# Patient Record
Sex: Male | Born: 1990 | Race: White | Hispanic: No | Marital: Single | Smoking: Current some day smoker
Health system: Southern US, Community
[De-identification: ages and names within clinical notes are randomized; demographics above are authoritative.]

## PROBLEM LIST (undated history)

## (undated) DIAGNOSIS — M549 Dorsalgia, unspecified: Secondary | ICD-10-CM

## (undated) HISTORY — PX: TYMPANOSTOMY TUBE PLACEMENT: SHX32

---
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
## 2011-04-25 ENCOUNTER — Inpatient Hospital Stay (INDEPENDENT_AMBULATORY_CARE_PROVIDER_SITE_OTHER)
Admission: RE | Admit: 2011-04-25 | Discharge: 2011-04-25 | Disposition: A | Discharge status: Home / Self Care | Payer: 59 | Source: Ambulatory Visit | Attending: Emergency Medicine | Admitting: Emergency Medicine

## 2011-04-25 DIAGNOSIS — J069 Acute upper respiratory infection, unspecified: Secondary | ICD-10-CM

## 2013-05-01 ENCOUNTER — Encounter (HOSPITAL_COMMUNITY): Payer: Self-pay | Admitting: Emergency Medicine

## 2013-05-01 ENCOUNTER — Emergency Department (HOSPITAL_COMMUNITY)
Admission: EM | Admit: 2013-05-01 | Discharge: 2013-05-01 | Disposition: A | Discharge status: Home / Self Care | Payer: 59 | Attending: Emergency Medicine | Admitting: Emergency Medicine

## 2013-05-01 DIAGNOSIS — Z8669 Personal history of other diseases of the nervous system and sense organs: Secondary | ICD-10-CM | POA: Insufficient documentation

## 2013-05-01 DIAGNOSIS — R599 Enlarged lymph nodes, unspecified: Secondary | ICD-10-CM | POA: Insufficient documentation

## 2013-05-01 DIAGNOSIS — F17200 Nicotine dependence, unspecified, uncomplicated: Secondary | ICD-10-CM | POA: Insufficient documentation

## 2013-05-01 DIAGNOSIS — K920 Hematemesis: Secondary | ICD-10-CM | POA: Insufficient documentation

## 2013-05-01 DIAGNOSIS — R1084 Generalized abdominal pain: Secondary | ICD-10-CM | POA: Insufficient documentation

## 2013-05-01 DIAGNOSIS — F430 Acute stress reaction: Secondary | ICD-10-CM | POA: Insufficient documentation

## 2013-05-01 DIAGNOSIS — R590 Localized enlarged lymph nodes: Secondary | ICD-10-CM

## 2013-05-01 DIAGNOSIS — J069 Acute upper respiratory infection, unspecified: Secondary | ICD-10-CM | POA: Insufficient documentation

## 2013-05-01 HISTORY — DX: Dorsalgia, unspecified: M54.9

## 2013-05-01 LAB — CBC WITH DIFFERENTIAL/PLATELET
Basophils Absolute: 0 10*3/uL (ref 0.0–0.1)
Basophils Relative: 0 % (ref 0–1)
Eosinophils Absolute: 0.1 10*3/uL (ref 0.0–0.7)
Eosinophils Relative: 1 % (ref 0–5)
HCT: 43.6 % (ref 39.0–52.0)
Lymphocytes Relative: 16 % (ref 12–46)
MCH: 31.2 pg (ref 26.0–34.0)
MCV: 90.6 fL (ref 78.0–100.0)
Monocytes Absolute: 0.8 10*3/uL (ref 0.1–1.0)
Neutro Abs: 7.7 10*3/uL (ref 1.7–7.7)
Neutrophils Relative %: 75 % (ref 43–77)
Platelets: 204 10*3/uL (ref 150–400)
RBC: 4.81 MIL/uL (ref 4.22–5.81)
RDW: 12.5 % (ref 11.5–15.5)
WBC: 10.2 10*3/uL (ref 4.0–10.5)

## 2013-05-01 LAB — RAPID STREP SCREEN (MED CTR MEBANE ONLY): Streptococcus, Group A Screen (Direct): NEGATIVE

## 2013-05-01 LAB — MONONUCLEOSIS SCREEN: Mono Screen: NEGATIVE

## 2013-05-01 MED ORDER — TRAMADOL HCL 50 MG PO TABS
ORAL_TABLET | ORAL | Status: AC
  Filled 2013-05-01: qty 1

## 2013-05-01 MED ORDER — TRAMADOL HCL 50 MG PO TABS
50.0000 mg | ORAL_TABLET | Freq: Once | ORAL | Status: AC
  Administered 2013-05-01: 50 mg via ORAL

## 2013-05-01 NOTE — ED Notes (Signed)
MD at bedside. 

## 2013-05-01 NOTE — ED Notes (Signed)
Pt complaining of fatigue, knots on right of neck, some sore throat, right ear pain.

## 2013-05-01 NOTE — ED Notes (Signed)
Pt c/o "swollen lymph notes on both sides of neck times x 2 weeks". Pt c/o sore throat and knot on right side of the neck. Pt reports "little bit of blood in my throw-up". Reports 2-3 episodes of emesis for past 2-3 days "I had to make myself to throw up. I felt really bad"

## 2013-05-01 NOTE — ED Provider Notes (Signed)
CSN: [id number]     Arrival date & time 05/01/13  4098 History  This chart was scribed for Geoffery Lyons, MD by Bennett Scrape, ED Scribe. This patient was seen in room APA07/APA07 and the patient's care was started at 8:30 PM.   Chief Complaint  Patient presents with  . Sore Throat  . Emesis    The history is provided by the patient. No language interpreter was used.    HPI Comments: Shaun Fox is a 22 y.o. male who presents to the Emergency Department complaining of persistent, gradually worsening right cervical lymph node swelling for the past 2 to 3 weeks. He denies any pain with the initial onset and states that the left side improved. He reports that 2 days ago the lymph nodes on the right side has been gradually increasing in size. He states that now he has constant pain that is worse with movement of his neck and opening his jaw with the associated development of a sore throat today. He denies any fevers or involvement of lymph nodes elsewhere. He also states that he had severe diffuse abdominal pain that resolved after 45 minutes today but denies any further episodes. He also reports hematemesis for the past 3 days stating that the emesis occasionally described as streaked with red blood and occasionally completely all red blood. He reports increased stress as well. He denies any sick contacts with similar symptoms. He has a h/o chronic ear infections but states that the last one occurred around the ago of 15. He had tubes placed in his ears.  Past Medical History  Diagnosis Date  . Back pain    Past Surgical History  Procedure Laterality Date  . Tympanostomy tube placement     No family history on file. History  Substance Use Topics  . Smoking status: Current Some Day Smoker  . Smokeless tobacco: Not on file  . Alcohol Use: Yes     Comment: occasional    Review of Systems  Constitutional: Negative for fever.  HENT: Positive for sore throat.   Gastrointestinal:  Positive for abdominal pain (now resolved ). Negative for nausea, vomiting and diarrhea.  Hematological: Positive for adenopathy.  All other systems reviewed and are negative.    Allergies  Acetaminophen and Benadryl  Home Medications  No current outpatient prescriptions on file.  Triage Vitals: BP 119/82  Pulse 81  Temp(Src) 98.1 F (36.7 C) (Oral)  Resp 20  Ht 5\' 9"  (1.753 m)  Wt 152 lb (68.947 kg)  BMI 22.44 kg/m2  SpO2 100%  Physical Exam  Nursing note and vitals reviewed. Constitutional: He is oriented to person, place, and time. He appears well-developed and well-nourished. No distress.  HENT:  Head: Normocephalic and atraumatic.  Mild inflammation of the PO but no exudates   Eyes: Conjunctivae and EOM are normal.  Neck: Normal range of motion. Neck supple. No tracheal deviation present.  Cardiovascular: Normal rate, regular rhythm and normal heart sounds.   No murmur heard. Pulmonary/Chest: Effort normal and breath sounds normal. No respiratory distress. He has no wheezes. He has no rales.  Abdominal: Soft. Bowel sounds are normal. There is no tenderness.  No splenomegaly   Musculoskeletal: Normal range of motion. He exhibits no edema.  Lymphadenopathy:    He has cervical adenopathy.  Neurological: He is alert and oriented to person, place, and time. No cranial nerve deficit.  Skin: Skin is warm and dry.  Psychiatric: He has a normal mood and affect. His behavior  is normal.    ED Course  Procedures (including critical care time)  DIAGNOSTIC STUDIES: Oxygen Saturation is 100% on room air, normal by my interpretation.    COORDINATION OF CARE: 8:38 PM-Discussed treatment plan which includes mononucleosis screen, CBC panel and rapid strep screen with pt at bedside and pt agreed to plan.   9:37 PM-Informed pt of negative lab work results. Pt has not had any vomiting in the ED. Advised pt that symptoms are most likely viral. Discussed discharge plan which includes  ibuprofen, rest and plenty of fluids with pt and pt agreed to plan. Also advised pt to follow up as needed and pt agreed. Addressed symptoms to return for with pt.   Labs Review Labs Reviewed  RAPID STREP SCREEN  CULTURE, GROUP A STREP  MONONUCLEOSIS SCREEN  CBC WITH DIFFERENTIAL   Imaging Review No results found.  EKG Interpretation   None       MDM  No diagnosis found. Patient presents with complaints of swollen lymph nodes in his neck and sore throat for the past 2 weeks. He states that he has felt fatigued and has had no energy. He denies sick contacts.  On exam he is afebrile vitals are stable. Physical examination is unremarkable with the exception of tender cervical lymph nodes which are mildly enlarged. I was suspicious of infectious mononucleosis however the CBC and Monospot did not indicate this. He was also negative for group A strep. I suspect this is a viral illness that will require time and patience. I've also recommended anti-inflammatories, plenty of fluids, rest and followup with his primary Dr. if not improving in the next 2-3 weeks.  I personally performed the services described in this documentation, which was scribed in my presence. The recorded information has been reviewed and is accurate.       Geoffery Lyons, MD 05/01/13 [id number]

## 2013-05-01 NOTE — ED Notes (Signed)
Patient given discharge instruction, verbalized understand. Patient ambulatory out of the department.  

## 2013-05-02 ENCOUNTER — Emergency Department (HOSPITAL_COMMUNITY): Payer: 59

## 2013-05-02 ENCOUNTER — Emergency Department (HOSPITAL_COMMUNITY)
Admission: EM | Admit: 2013-05-02 | Discharge: 2013-05-02 | Disposition: A | Discharge status: Home / Self Care | Payer: 59 | Attending: Emergency Medicine | Admitting: Emergency Medicine

## 2013-05-02 ENCOUNTER — Encounter (HOSPITAL_COMMUNITY): Payer: Self-pay | Admitting: Emergency Medicine

## 2013-05-02 DIAGNOSIS — H9209 Otalgia, unspecified ear: Secondary | ICD-10-CM | POA: Insufficient documentation

## 2013-05-02 DIAGNOSIS — J029 Acute pharyngitis, unspecified: Secondary | ICD-10-CM | POA: Insufficient documentation

## 2013-05-02 DIAGNOSIS — I889 Nonspecific lymphadenitis, unspecified: Secondary | ICD-10-CM

## 2013-05-02 DIAGNOSIS — F17200 Nicotine dependence, unspecified, uncomplicated: Secondary | ICD-10-CM | POA: Insufficient documentation

## 2013-05-02 LAB — BASIC METABOLIC PANEL
BUN: 6 mg/dL (ref 6–23)
Chloride: 94 mEq/L — ABNORMAL LOW (ref 96–112)
Creatinine, Ser: 1.1 mg/dL (ref 0.50–1.35)
GFR calc Af Amer: >90 mL/min (ref >90)
GFR calc non Af Amer: >90 mL/min (ref >90)
Glucose, Bld: 89 mg/dL (ref 70–99)
Potassium: 3.7 mEq/L (ref 3.5–5.1)
Sodium: 133 mEq/L — ABNORMAL LOW (ref 135–145)

## 2013-05-02 LAB — CBC WITH DIFFERENTIAL/PLATELET
Basophils Absolute: 0 10*3/uL (ref 0.0–0.1)
Basophils Relative: 0 % (ref 0–1)
Eosinophils Relative: 2 % (ref 0–5)
HCT: 45.4 % (ref 39.0–52.0)
Hemoglobin: 15.6 g/dL (ref 13.0–17.0)
Lymphs Abs: 1.5 10*3/uL (ref 0.7–4.0)
MCH: 31.4 pg (ref 26.0–34.0)
Monocytes Relative: 11 % (ref 3–12)
Neutro Abs: 6.4 10*3/uL (ref 1.7–7.7)
Neutrophils Relative %: 71 % (ref 43–77)
Platelets: 195 10*3/uL (ref 150–400)
RBC: 4.97 MIL/uL (ref 4.22–5.81)
RDW: 12.6 % (ref 11.5–15.5)

## 2013-05-02 MED ORDER — AMOXICILLIN-POT CLAVULANATE 875-125 MG PO TABS: 1.0000 | ORAL_TABLET | Freq: Two times a day (BID) | ORAL | Status: DC

## 2013-05-02 MED ORDER — TRAMADOL HCL 50 MG PO TABS: 50.0000 mg | ORAL_TABLET | Freq: Four times a day (QID) | ORAL | Status: DC | PRN

## 2013-05-02 MED ORDER — HYDROMORPHONE HCL PF 1 MG/ML IJ SOLN
INTRAMUSCULAR | Status: AC
  Filled 2013-05-02: qty 1

## 2013-05-02 MED ORDER — NAPROXEN 500 MG PO TABS: 500.0000 mg | ORAL_TABLET | Freq: Two times a day (BID) | ORAL | Status: DC

## 2013-05-02 MED ORDER — HYDROMORPHONE HCL PF 1 MG/ML IJ SOLN
1.0000 mg | Freq: Once | INTRAMUSCULAR | Status: AC
  Administered 2013-05-02: 1 mg via INTRAVENOUS
  Filled 2013-05-02: qty 1

## 2013-05-02 MED ORDER — SODIUM CHLORIDE 0.9 % IV BOLUS (SEPSIS)
1000.0000 mL | Freq: Once | INTRAVENOUS | Status: AC
  Administered 2013-05-02: 1000 mL via INTRAVENOUS

## 2013-05-02 MED ORDER — LORAZEPAM 2 MG/ML IJ SOLN
0.5000 mg | Freq: Once | INTRAMUSCULAR | Status: AC
  Administered 2013-05-02: 0.5 mg via INTRAVENOUS
  Filled 2013-05-02: qty 1

## 2013-05-02 MED ORDER — IOHEXOL 300 MG/ML  SOLN
75.0000 mL | Freq: Once | INTRAMUSCULAR | Status: AC | PRN
  Administered 2013-05-02: 75 mL via INTRAVENOUS

## 2013-05-02 NOTE — ED Notes (Signed)
Patient to Ed c/o neck pain. States that he was seen here yesterday for neck pain and swollen lymph nodes. He states he was told to go home and get some rest and to take ibuprofen. Patient states that he did not get any rest last night, total of 2 hours of sleep and could not move neck when he awoke this morning. Patient grimacing, keeps sunglasses on, states 12/10 pain on his neck. No ibuprofen taken.

## 2013-05-02 NOTE — ED Notes (Signed)
Patient with no complaints at this time. Respirations even and unlabored. Skin warm/dry. Discharge instructions reviewed with patient at this time. Patient given opportunity to voice concerns/ask questions. Patient discharged at this time and left Emergency Department with steady gait.   

## 2013-05-02 NOTE — ED Provider Notes (Signed)
CSN: [id number]     Arrival date & time 05/02/13  1152 History  This chart was scribed for Benny Lennert, MD by Caryn Bee, ED Scribe. This patient was seen in room APA17/APA17 and the patient's care was started 1:18 PM.    Chief Complaint  Patient presents with  . Neck Pain    Patient is a 22 y.o. male presenting with pharyngitis. The history is provided by the patient. No language interpreter was used.  Sore Throat This is a new problem. The current episode started yesterday. The problem occurs constantly. The problem has not changed since onset.Pertinent negatives include no chest pain, no abdominal pain, no headaches and no shortness of breath. The symptoms are aggravated by swallowing. Nothing relieves the symptoms. He has tried nothing for the symptoms.   HPI Comments: Shaun Fox is a 21 y.o. male who presents to the Emergency Department complaining of gradual onset severe neck pain that began when he woke this morning. Pt states that a couple of weeks ago both of the lymphnodes in his neck were swollen, but had improved. Pt also has associated pain when he opens his mouth, in his ears, and in his jaw. Pt was seen here yesterday for similar symptoms and was discharged with ibuprofen. He has not taken any ibuprofen for the pain today. Pt denies cough. Pt PCP is Dr. Gerda Diss.   Past Medical History  Diagnosis Date  . Back pain    Past Surgical History  Procedure Laterality Date  . Tympanostomy tube placement     No family history on file. History  Substance Use Topics  . Smoking status: Current Some Day Smoker  . Smokeless tobacco: Not on file  . Alcohol Use: 2.4 oz/week    4 Cans of beer per week     Comment: occasional    Review of Systems  Constitutional: Negative for appetite change and fatigue.  HENT: Positive for ear pain, sore throat and trouble swallowing. Negative for congestion, ear discharge and sinus pressure.   Eyes: Negative for discharge.  Respiratory:  Negative for cough and shortness of breath.   Cardiovascular: Negative for chest pain.  Gastrointestinal: Negative for abdominal pain and diarrhea.  Genitourinary: Negative for frequency and hematuria.  Musculoskeletal: Negative for back pain.  Skin: Negative for rash.  Neurological: Negative for seizures and headaches.  Psychiatric/Behavioral: Negative for hallucinations.  All other systems reviewed and are negative.    Allergies  Acetaminophen; Benadryl; and Ceclor  Home Medications    Triage Vitals: BP 127/58  Pulse 87  Temp(Src) 99.3 F (37.4 C) (Oral)  Resp 16  Ht 5\' 9"  (1.753 m)  Wt 155 lb (70.308 kg)  BMI 22.88 kg/m2  SpO2 100%  Physical Exam  Nursing note and vitals reviewed. Constitutional: He is oriented to person, place, and time. He appears well-developed and well-nourished.  HENT:  Head: Normocephalic.  Eyes: Conjunctivae and EOM are normal. No scleral icterus.  Neck: Neck supple. No thyromegaly present.  Tenderness and edema 2 cm by 1 cm lateral right neck.  Cardiovascular: Normal rate, regular rhythm and normal heart sounds.  Exam reveals no gallop and no friction rub.   No murmur heard. Pulmonary/Chest: Effort normal and breath sounds normal. No stridor. He has no wheezes. He has no rales. He exhibits no tenderness.  Abdominal: He exhibits no distension. There is no tenderness. There is no rebound.  Musculoskeletal: Normal range of motion. He exhibits no edema.  Lymphadenopathy:    He has  no cervical adenopathy.  Neurological: He is alert and oriented to person, place, and time. He exhibits normal muscle tone. Coordination normal.  Skin: No rash noted. No erythema.  Psychiatric: He has a normal mood and affect. His behavior is normal.    ED Course  Procedures (including critical care time) DIAGNOSTIC STUDIES: Oxygen Saturation is 100% on room air, normal by my interpretation.    COORDINATION OF CARE: 1:23 PM-Discussed treatment plan which includes  CBC, basic metabolic panel, pain medications with pt at bedside and pt agreed to plan.   Labs Review Labs Reviewed  BASIC METABOLIC PANEL - Abnormal; Notable for the following:    Sodium 133 (*)    Chloride 94 (*)    All other components within normal limits  CBC WITH DIFFERENTIAL   Imaging Review No results found.  EKG Interpretation   None      Medications  HYDROmorphone (DILAUDID) injection 1 mg (not administered)  sodium chloride 0.9 % bolus 1,000 mL (1,000 mLs Intravenous New Bag/Given 05/02/13 1410)  HYDROmorphone (DILAUDID) injection 1 mg (1 mg Intravenous Given 05/02/13 1410)  LORazepam (ATIVAN) injection 0.5 mg (0.5 mg Intravenous Given 05/02/13 1410)    MDM  No diagnosis found. lymphadenits The chart was scribed for me under my direct supervision.  I personally performed the history, physical, and medical decision making and all procedures in the evaluation of this patient.Benny Lennert, MD 05/02/13 2115

## 2013-05-03 LAB — CULTURE, GROUP A STREP

## 2013-05-23 ENCOUNTER — Ambulatory Visit (INDEPENDENT_AMBULATORY_CARE_PROVIDER_SITE_OTHER): Payer: 59 | Admitting: Family Medicine

## 2013-05-23 ENCOUNTER — Encounter: Payer: Self-pay | Admitting: Family Medicine

## 2013-05-23 VITALS — BP 124/68 | Temp 98.3°F | Ht 68.0 in | Wt 154.0 lb

## 2013-05-23 DIAGNOSIS — I889 Nonspecific lymphadenitis, unspecified: Secondary | ICD-10-CM

## 2013-05-23 LAB — CBC WITH DIFFERENTIAL/PLATELET
Basophils Relative: 0 % (ref 0–1)
Eosinophils Absolute: 0.2 10*3/uL (ref 0.0–0.7)
Hemoglobin: 15.5 g/dL (ref 13.0–17.0)
Lymphocytes Relative: 22 % (ref 12–46)
Lymphs Abs: 1.9 10*3/uL (ref 0.7–4.0)
MCH: 31.6 pg (ref 26.0–34.0)
MCHC: 34.6 g/dL (ref 30.0–36.0)
MCV: 91.4 fL (ref 78.0–100.0)
Monocytes Absolute: 0.8 10*3/uL (ref 0.1–1.0)
Monocytes Relative: 9 % (ref 3–12)
Neutro Abs: 5.8 10*3/uL (ref 1.7–7.7)
Neutrophils Relative %: 67 % (ref 43–77)
Platelets: 264 10*3/uL (ref 150–400)
RBC: 4.9 MIL/uL (ref 4.22–5.81)
RDW: 13.7 % (ref 11.5–15.5)
WBC: 8.7 10*3/uL (ref 4.0–10.5)

## 2013-05-23 MED ORDER — CLARITHROMYCIN 500 MG PO TABS: 500.0000 mg | ORAL_TABLET | Freq: Two times a day (BID) | ORAL | Status: AC

## 2013-05-23 MED ORDER — TRAMADOL HCL 50 MG PO TABS: 50.0000 mg | ORAL_TABLET | Freq: Four times a day (QID) | ORAL | Status: DC | PRN

## 2013-05-23 NOTE — Progress Notes (Signed)
  Subjective:    Patient ID: Shaun Fox, male    DOB: 04-Aug-1990, 22 y.o.   MRN: [id number]  HPIFollow up from ER. Patient went to ER two different times for ear pain, neck pain and swollen glands. He has finished the antibiotic and the naproxen that was prescribed.  Patient was seen in the ER they did a CAT scan to look at the lymph nodes the lymph nodes are swollen tender painful on the right side did not respond to the Augmentin. No fever sweats chills nausea or vomiting. Energy level slightly subpar. PMH benign. Patient is not around any cats. No other particular problems. Family history noncontributory.   Review of Systems See above. No night sweats no weight loss. Energy level halfway decent. No other particular problems.    Objective:   Physical Exam Right side neck is swollen lymph nodes are enlarged. There is some tenderness to the lymph nodes. Extremities no edema abdomen soft lungs clear heart regular pulse normal       Assessment & Plan:  Cervical lymphadenitis-persistent. I would recommend a round of antibiotics. Also recommend that we do lab work. In addition to this will need to set up with ENT. If the lab work is not revealing and patient persists with this cervical lymphadenitis he will need a lymph node biopsy. The possibility of lymphoma is low because he does have tenderness with the lymph nodes. Patient understands the importance of following through with all of this.

## 2013-05-24 LAB — EPSTEIN-BARR VIRUS VCA, IGM: EBV VCA IgM: <10 U/mL (ref <36.0)

## 2013-05-24 LAB — EPSTEIN-BARR VIRUS VCA, IGG: EBV VCA IgG: <10 U/mL (ref <18.0)

## 2013-05-24 LAB — EPSTEIN-BARR VIRUS NUCLEAR ANTIGEN ANTIBODY, IGG: EBV NA IgG: 568 U/mL — ABNORMAL HIGH (ref <18.0)

## 2013-05-24 LAB — SEDIMENTATION RATE: Sed Rate: 4 mm/hr (ref 0–16)

## 2013-05-24 LAB — EPSTEIN-BARR VIRUS EARLY D ANTIGEN ANTIBODY, IGG: EBV EA IgG: <5 U/mL (ref <9.0)

## 2013-05-26 ENCOUNTER — Other Ambulatory Visit: Payer: Self-pay | Admitting: Family Medicine

## 2013-05-26 DIAGNOSIS — R590 Localized enlarged lymph nodes: Secondary | ICD-10-CM

## 2013-06-01 ENCOUNTER — Other Ambulatory Visit: Payer: Self-pay | Admitting: *Deleted

## 2013-06-01 MED ORDER — TRAMADOL HCL 50 MG PO TABS: 50.0000 mg | ORAL_TABLET | Freq: Four times a day (QID) | ORAL | Status: AC | PRN

## 2013-06-02 ENCOUNTER — Other Ambulatory Visit (HOSPITAL_COMMUNITY)
Admission: RE | Admit: 2013-06-02 | Discharge: 2013-06-02 | Disposition: A | Discharge status: Home / Self Care | Payer: 59 | Source: Ambulatory Visit | Attending: Otolaryngology | Admitting: Otolaryngology

## 2013-06-02 ENCOUNTER — Other Ambulatory Visit: Payer: Self-pay | Admitting: Otolaryngology

## 2013-06-02 DIAGNOSIS — R220 Localized swelling, mass and lump, head: Secondary | ICD-10-CM | POA: Insufficient documentation

## 2013-07-25 ENCOUNTER — Telehealth: Payer: Self-pay | Admitting: Family Medicine

## 2013-07-25 DIAGNOSIS — R599 Enlarged lymph nodes, unspecified: Secondary | ICD-10-CM

## 2013-07-25 NOTE — Telephone Encounter (Signed)
Referral put in the system.

## 2013-07-25 NOTE — Telephone Encounter (Signed)
Mom LMOVM for me - requesting referral to different ENT for pt's knots in lymph nodes, has seen Dr. Pollyann Kennedyosen a couple times, still with knots and pain, would like referral to Dr[facility] Susz[facility]  If "ok" please initiate referral in system so that I may process or does pt NTBS? Please advise

## 2013-07-25 NOTE — Telephone Encounter (Signed)
[place]

## 2015-01-04 ENCOUNTER — Encounter: Payer: Self-pay | Admitting: Family Medicine

## 2015-01-04 ENCOUNTER — Ambulatory Visit (INDEPENDENT_AMBULATORY_CARE_PROVIDER_SITE_OTHER): Payer: 59 | Admitting: Family Medicine

## 2015-01-04 VITALS — BP 112/70 | Temp 98.0°F | Ht 69.0 in | Wt 176.0 lb

## 2015-01-04 DIAGNOSIS — H18821 Corneal disorder due to contact lens, right eye: Secondary | ICD-10-CM | POA: Diagnosis not present

## 2015-01-04 DIAGNOSIS — H7291 Unspecified perforation of tympanic membrane, right ear: Secondary | ICD-10-CM

## 2015-01-04 MED ORDER — OFLOXACIN 0.3 % OT SOLN: 5.0000 [drp] | Freq: Every day | OTIC | Status: DC

## 2015-01-04 MED ORDER — CITALOPRAM HYDROBROMIDE 20 MG PO TABS: 20.0000 mg | ORAL_TABLET | Freq: Every day | ORAL | Status: DC

## 2015-01-04 MED ORDER — NYSTATIN 100000 UNIT/ML MT SUSP: OROMUCOSAL | Status: DC

## 2015-01-04 MED ORDER — ALBUTEROL SULFATE HFA 108 (90 BASE) MCG/ACT IN AERS: 2.0000 | INHALATION_SPRAY | RESPIRATORY_TRACT | Status: DC | PRN

## 2015-01-04 MED ORDER — AZITHROMYCIN 250 MG PO TABS: ORAL_TABLET | ORAL | Status: DC

## 2015-01-04 NOTE — Patient Instructions (Signed)
If eye not improved by Monday call  Recheck ear in 3 weeks keep dry as possible  Use ear drops if infection occurs  Call me if any problems

## 2015-01-04 NOTE — Addendum Note (Signed)
Addended by: Margaretha Sheffield on: 01/04/2015 02:31 PM   Modules accepted: Orders, Medications

## 2015-01-04 NOTE — Progress Notes (Signed)
   Subjective:    Patient ID: DETAVIUS BLOXSOM, male    DOB: [birth year], 24 y.o.   MRN: [id number]  HPIplaying in the pool yesterday jumped in and right side of head hit the water and when he came up he could not hear out of right ear. No ear pain.   Having right eye pain. Started this am. Pt wears contacts. He thinks he might have scratched his eye. Sensitive to light.    Denies fever chills sinus pressure pain discomfort  Review of Systems     Objective:   Physical Exam Neck normal sinuses nontender left eardrum normal right ear from small perforation noted with minimal redness around perforation no drainage right eye has injection of sclera but not severe clear watering nontender fluorescent drops were placed into the eye using a blue light Ace small abrasion noted on the cornea not severe       Assessment & Plan:  Right eardrum perforation should heal up gradually over the next 3 weeks there is some redness to the eardrum no drainage the actual perforation is very small probably 2 mm recheck this again in 3 weeks if not healing up referral to ENT, if signs of infection occur use eardrops  Right eye scleral abrasion minimal corneal abrasion should gradually heal up over-the-counter allergy eyedrops when necessary if any signs of infection to call us patient not to use his contact over the next few days

## 2015-03-16 IMAGING — CT CT NECK W/ CM
3 of 4 series · 12 of 33 positions shown, 14 images · IV contrast (Omnipaque 300)
Comparison: None.

CLINICAL DATA: 22-year-old male with neck pain on the right for
several days, increasing. Right ear pain, right jaw pain, radiating
inferiorly. Initial encounter.

EXAM:
CT NECK WITH CONTRAST
TECHNIQUE: Multidetector CT imaging of the neck was performed using the
standard protocol following the bolus administration of intravenous
contrast.
CONTRAST:  75mL OMNIPAQUE IOHEXOL 300 MG/ML  SOLN

[Series 2: soft tissue neck 2.0 b31s · axial · 0.36mm/px · z∈[+65,+233]mm · 4 of 126 slices shown, 5 images]
[im 21/126  soft-tissue]
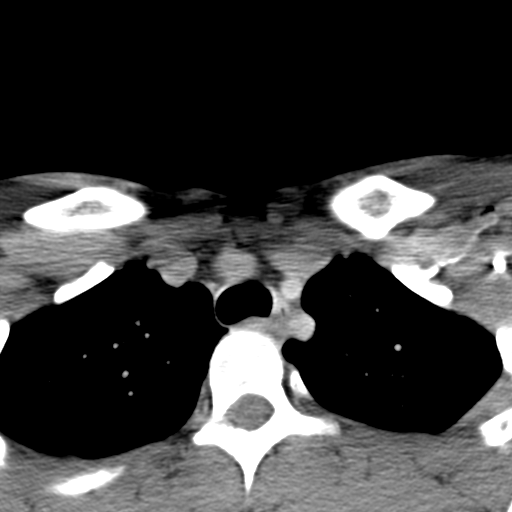
[im 21/126  bone]
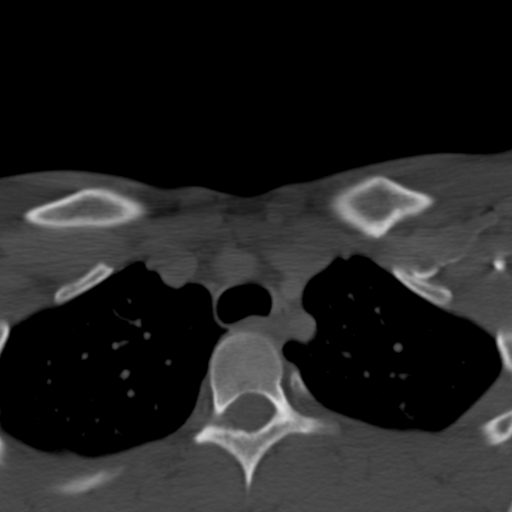
[im 42/126  bone]
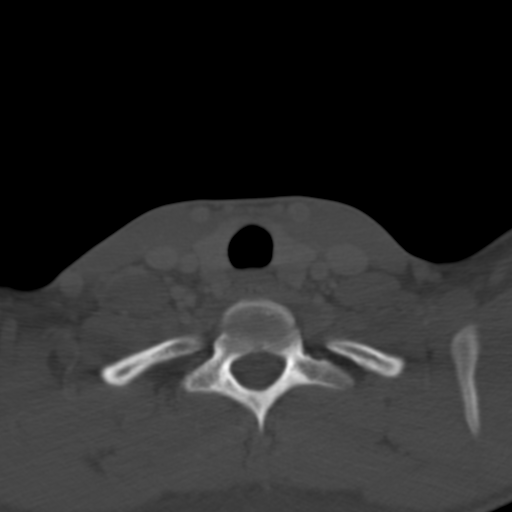
[im 84/126  bone]
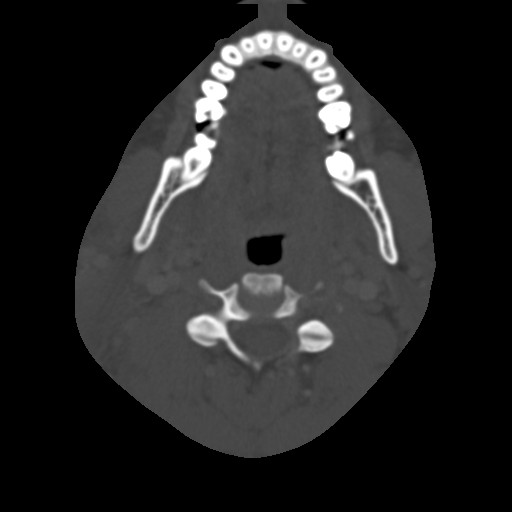
[im 105/126  bone]
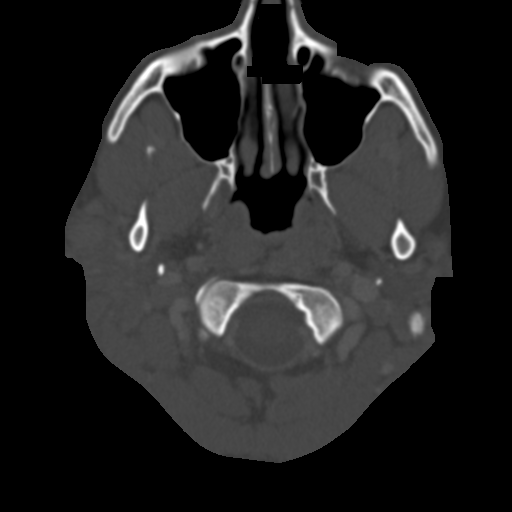

[Series 4: neck 2.0 soft tissue sag · sagittal · 0.35mm/px · 5 of 78 slices shown, 6 images]
[im 26/78  bone]
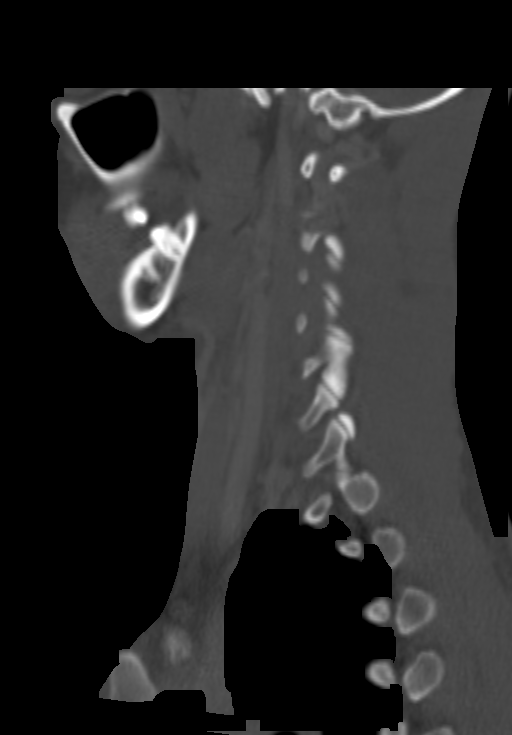
[im 33/78  bone]
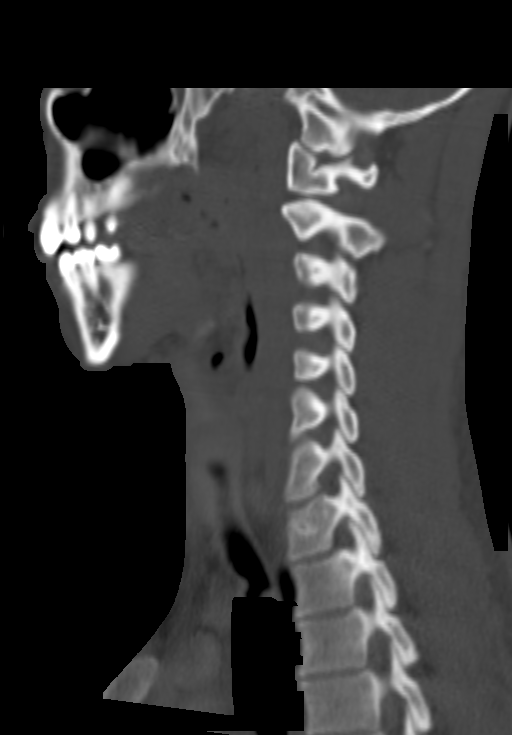
[im 39/78  soft-tissue]
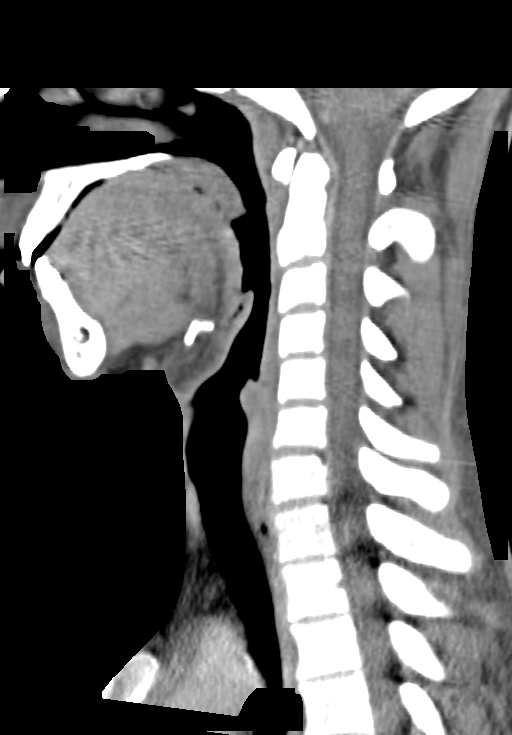
[im 39/78  bone]
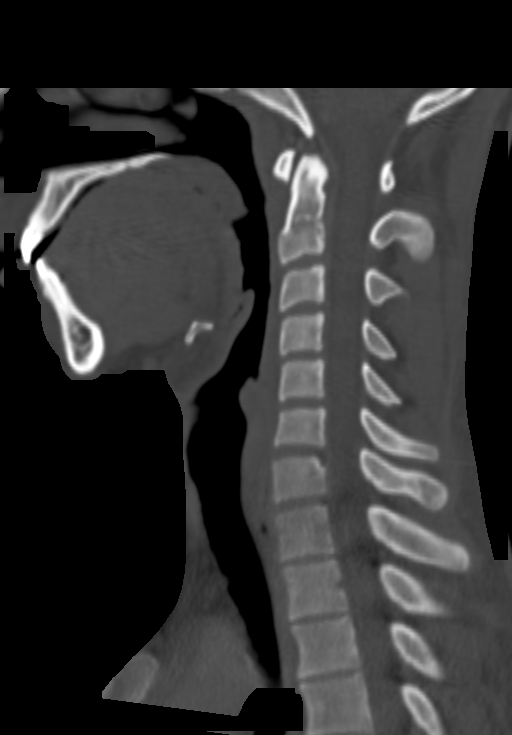
[im 45/78  bone]
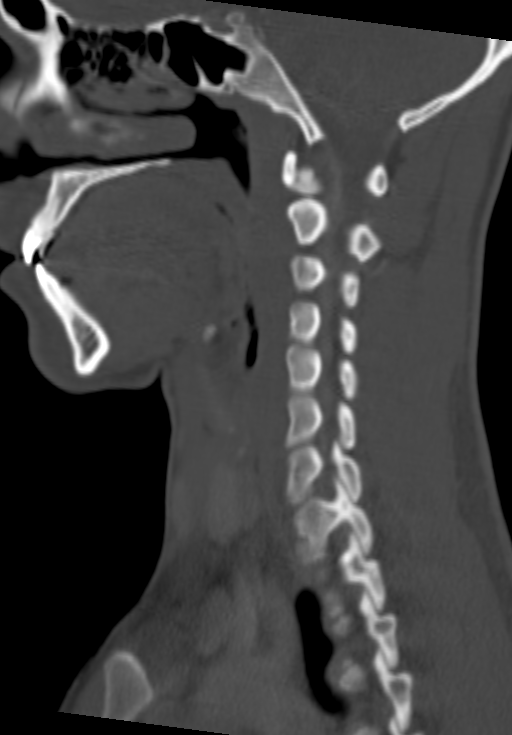
[im 52/78  bone]
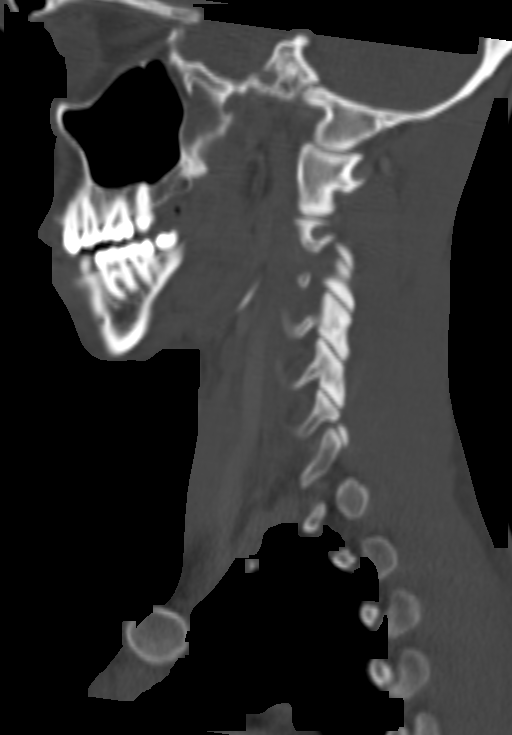

[Series 5: neck 2.0 soft tissue coro · coronal · 0.31mm/px · 3 of 79 slices shown]
[im 16/79  bone]
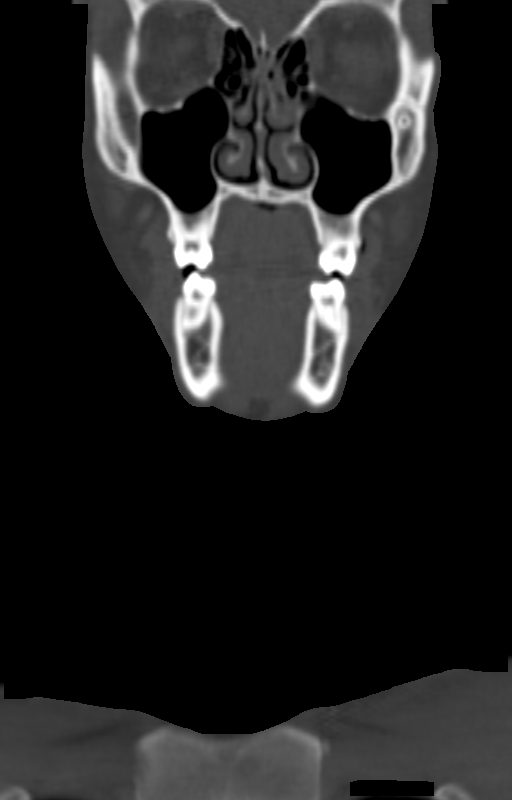
[im 32/79  bone]
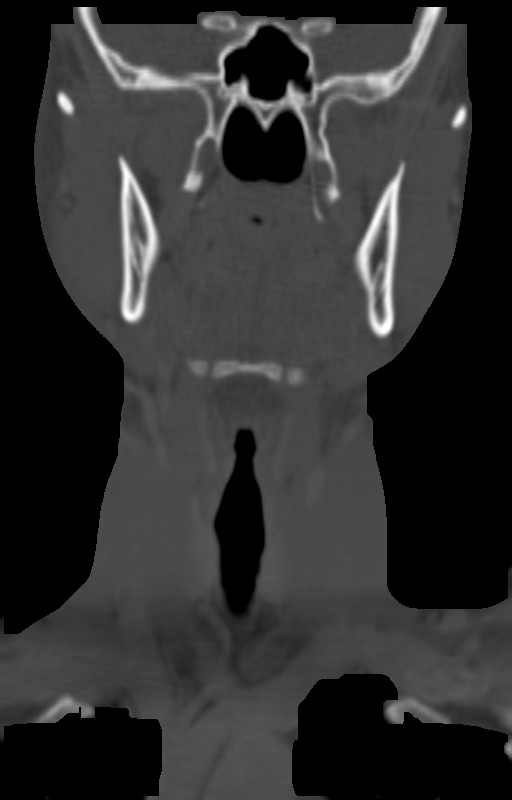
[im 47/79  bone]
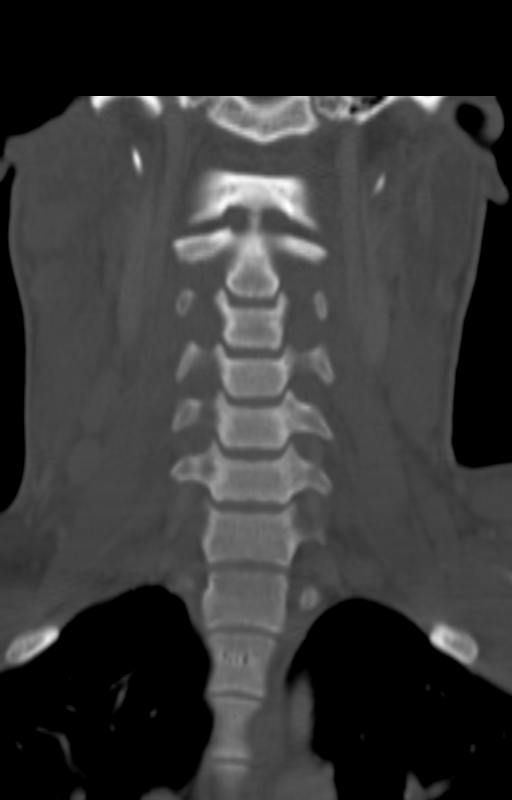

[12 of 33 positions shown; findings below may reference images not displayed]

FINDINGS: Small volume retained secretions along the right lateral wall of the
trachea in the upper chest (series 3, image 103). Negative lung
apices. Negative visualized superior mediastinum. No osseous
abnormality in the visible thorax.

Negative thyroid, larynx, pharynx, parapharyngeal spaces,
retropharyngeal space, sublingual space, submandibular glands, and
left parotid gland. Visualized orbit soft tissues are within normal
limits. Visualized paranasal sinuses and mastoids are clear. Both
tympanic cavities are clear. It No acute osseous abnormality
identified.

Major vascular structures in the neck and at the skullbase are
patent, including the right internal jugular vein. Negative
visualized brain parenchyma.

Abnormal enlarged and heterogeneously hyperenhancing lymph nodes at
multiple nodal stations on the right, from the level 5/level IIb
station tracking inferiorly to level 4. The largest node or nodal
conglomeration is 17 mm in short axis at the level IIIb station
(series 2, image 53 and sagittal image 14). There do appear to be
*CRASH* that there does appear to be associated fat stranding along
this abnormal nodal chain, and also in the nearby right paraspinal
muscle fat plane to a lesser extent (arrow on series 2, image 58).
Evidence of abnormal right parotid space nodules also suspected to
be lymph nodes, up to 13 mm short axes (on series 2, image 33, 21).

No abnormal level 1 nodes. No abnormal left-sided nodes. No fluid
collection or drainable fluid identified.
IMPRESSION: 1. Abnormal right neck lymph nodal chain from the right parotid
space tracking inferiorly. Nodes appear to be enlarged and
heterogeneously hyperenhancing. The largest is 17 mm at level IIIb.
No overtly cystic or necrotic nodes. Evidence of associated
inflammatory stranding in the adjacent fat. No neck abscess or
drainable fluid identified.

2. Favor infectious process in this age group (lymphadenitis,
cellulitis). The pharynx/larynx do not appear to be involved. There
is trace retained secretions in the trachea. Consider atypical
infections if there is immunocompromise.

3. If the patient does not improve as expected and/or the lymph
nodes do not resolve, then consider a lymphoproliferative disorder
or less likely other malignancy. The largest right level 3 node
probably would be amenable to ultrasound-guided needle biopsy.

## 2015-08-06 ENCOUNTER — Emergency Department (HOSPITAL_COMMUNITY): Payer: 59

## 2015-08-06 ENCOUNTER — Encounter (HOSPITAL_COMMUNITY): Payer: Self-pay | Admitting: *Deleted

## 2015-08-06 ENCOUNTER — Emergency Department (HOSPITAL_COMMUNITY)
Admission: EM | Admit: 2015-08-06 | Discharge: 2015-08-06 | Disposition: A | Discharge status: Home / Self Care | Payer: 59 | Attending: Emergency Medicine | Admitting: Emergency Medicine

## 2015-08-06 DIAGNOSIS — T405X1A Poisoning by cocaine, accidental (unintentional), initial encounter: Secondary | ICD-10-CM | POA: Diagnosis not present

## 2015-08-06 DIAGNOSIS — Y9389 Activity, other specified: Secondary | ICD-10-CM | POA: Diagnosis not present

## 2015-08-06 DIAGNOSIS — X58XXXA Exposure to other specified factors, initial encounter: Secondary | ICD-10-CM | POA: Diagnosis not present

## 2015-08-06 DIAGNOSIS — T402X1A Poisoning by other opioids, accidental (unintentional), initial encounter: Secondary | ICD-10-CM | POA: Diagnosis not present

## 2015-08-06 DIAGNOSIS — Y9289 Other specified places as the place of occurrence of the external cause: Secondary | ICD-10-CM | POA: Diagnosis not present

## 2015-08-06 DIAGNOSIS — T507X1A Poisoning by analeptics and opioid receptor antagonists, accidental (unintentional), initial encounter: Secondary | ICD-10-CM | POA: Insufficient documentation

## 2015-08-06 DIAGNOSIS — F17210 Nicotine dependence, cigarettes, uncomplicated: Secondary | ICD-10-CM | POA: Diagnosis not present

## 2015-08-06 DIAGNOSIS — R55 Syncope and collapse: Secondary | ICD-10-CM | POA: Insufficient documentation

## 2015-08-06 DIAGNOSIS — Y998 Other external cause status: Secondary | ICD-10-CM | POA: Insufficient documentation

## 2015-08-06 DIAGNOSIS — R0902 Hypoxemia: Secondary | ICD-10-CM | POA: Diagnosis not present

## 2015-08-06 DIAGNOSIS — T50901A Poisoning by unspecified drugs, medicaments and biological substances, accidental (unintentional), initial encounter: Secondary | ICD-10-CM

## 2015-08-06 LAB — COMPREHENSIVE METABOLIC PANEL WITH GFR
ALT: 17 U/L (ref 17–63)
AST: 21 U/L (ref 15–41)
Albumin: 4.5 g/dL (ref 3.5–5.0)
Alkaline Phosphatase: 85 U/L (ref 38–126)
Anion gap: 9 (ref 5–15)
BUN: 12 mg/dL (ref 6–20)
CO2: 30 mmol/L (ref 22–32)
Calcium: 9.7 mg/dL (ref 8.9–10.3)
Chloride: 99 mmol/L — ABNORMAL LOW (ref 101–111)
Creatinine, Ser: 1.26 mg/dL — ABNORMAL HIGH (ref 0.61–1.24)
GFR calc Af Amer: >60 mL/min
GFR calc non Af Amer: >60 mL/min
Glucose, Bld: 101 mg/dL — ABNORMAL HIGH (ref 65–99)
Potassium: 4.1 mmol/L (ref 3.5–5.1)
Sodium: 138 mmol/L (ref 135–145)
Total Bilirubin: 0.6 mg/dL (ref 0.3–1.2)
Total Protein: 7.8 g/dL (ref 6.5–8.1)

## 2015-08-06 LAB — RAPID URINE DRUG SCREEN, HOSP PERFORMED
Amphetamines: NOT DETECTED
BARBITURATES: NOT DETECTED
BENZODIAZEPINES: NOT DETECTED
COCAINE: POSITIVE — AB
Opiates: POSITIVE — AB
TETRAHYDROCANNABINOL: NOT DETECTED

## 2015-08-06 LAB — CBC WITH DIFFERENTIAL/PLATELET
Basophils Absolute: 0 K/uL (ref 0.0–0.1)
Basophils Relative: 0 %
Eosinophils Absolute: 0.1 K/uL (ref 0.0–0.7)
Eosinophils Relative: 1 %
HCT: 43.9 % (ref 39.0–52.0)
Hemoglobin: 14.9 g/dL (ref 13.0–17.0)
Lymphocytes Relative: 22 %
Lymphs Abs: 1.5 K/uL (ref 0.7–4.0)
MCH: 29.9 pg (ref 26.0–34.0)
MCHC: 33.9 g/dL (ref 30.0–36.0)
MCV: 88.2 fL (ref 78.0–100.0)
Monocytes Absolute: 0.5 K/uL (ref 0.1–1.0)
Monocytes Relative: 7 %
Neutro Abs: 5 K/uL (ref 1.7–7.7)
Neutrophils Relative %: 70 %
Platelets: 226 K/uL (ref 150–400)
RBC: 4.98 MIL/uL (ref 4.22–5.81)
RDW: 12.3 % (ref 11.5–15.5)
WBC: 7.1 K/uL (ref 4.0–10.5)

## 2015-08-06 LAB — ACETAMINOPHEN LEVEL: Acetaminophen (Tylenol), Serum: <10 ug/mL — ABNORMAL LOW (ref 10–30)

## 2015-08-06 LAB — SALICYLATE LEVEL: Salicylate Lvl: <4 mg/dL (ref 2.8–30.0)

## 2015-08-06 LAB — ETHANOL: Alcohol, Ethyl (B): <5 mg/dL

## 2015-08-06 NOTE — ED Provider Notes (Signed)
CSN: [id number]     Arrival date & time 08/06/15  2033 History  By signing my name below, I, [facility], attest that this documentation has been prepared under the direction and in the presence of Lavera Guise, MD. Electronically Signed: Randell Patient, ED Scribe. 08/06/2015. 2:59 PM.      Chief Complaint  Patient presents with  . Drug Overdose    The history is provided by the patient and the EMS personnel. No language interpreter was used.    HPI Comments: Shaun Fox is a 25 y.o. male brought in by EMS with no pertinent chronic conditions who presents to the Emergency Department after a drug overdose that occurred 1.5 hours ago. Patient reports that he took a drug combination called [place] white 1.5 hours ago followed by a witnessed syncope and cyanosis by his friends who were present in the room with him. He states that he woke after his mother performed a sternal rub and gave him rescue breaths for several minutes. Per patient, he took a dose of the same drug this morning with none of the symptoms that occurred earlier tonight. 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 Patient denies EtOH consumption or other illicit drugs. He denies nausea, vomiting, abdominal pain, difficulty breathing, CP, head injuries, traumas, and falls. Denies suicide attempt  Past Medical History  Diagnosis Date  . Back pain    Past Surgical History  Procedure Laterality Date  . Tympanostomy tube placement     History reviewed. No pertinent family history. Social History  Substance Use Topics  . Smoking status: Current Some Day Smoker -- 0.25 packs/day  . Smokeless tobacco: None  . Alcohol Use: 2.4 oz/week    4 Cans of beer per week     Comment: occasional    Review of Systems  Respiratory:       Negative for difficulty breathing.  Cardiovascular: Negative for chest pain.  Gastrointestinal: Negative for nausea and vomiting.  Skin:  Positive for color change (Cyanotic (resolved PTA)).  Neurological: Positive for syncope (PTA).  All other systems reviewed and are negative.     Allergies  Acetaminophen; Benadryl; and Ceclor  Home Medications   Prior to Admission medications   Not on File   BP 126/75 mmHg  Pulse 69  Temp(Src) 98.1 F (36.7 C) (Oral)  Resp 13  Ht  (1.753 m)  Wt 172 lb (78.019 kg)  BMI 25.39 kg/m2  SpO2 100% Physical Exam  Nursing note and vitals reviewed. Constitutional: Well developed, well nourished, non-toxic, and in no acute distress Head: Normocephalic and atraumatic.  Mouth/Throat: Oropharynx is clear and moist. Mucous membranes are dry. Neck: Normal range of motion. Neck supple. Eyes: Pupils 2 mm and symmetric but not reactive to light.   Cardiovascular: Normal rate and regular rhythm.   Pulmonary/Chest: Effort normal and breath sounds normal.  Abdominal: Soft. There is no tenderness. There is no rebound and no guarding.  Musculoskeletal: Normal range of motion.  Neurological: Alert, no facial droop, fluent speech, moves all extremities symmetrically Skin: Skin is warm and dry.  Psychiatric: Cooperative   ED Course  Procedures  DIAGNOSTIC STUDIES: Oxygen Saturation is 91% on RA, low by my interpretation.    COORDINATION OF CARE: 8:45 PM Will observe in ED. Discussed treatment plan with pt at bedside and pt agreed to plan.  Labs Review Labs Reviewed  COMPREHENSIVE METABOLIC PANEL - Abnormal; Notable for the  following:    Chloride 99 (*)    Glucose, Bld 101 (*)    Creatinine, Ser 1.26 (*)    All other components within normal limits  ACETAMINOPHEN LEVEL - Abnormal; Notable for the following:    Acetaminophen (Tylenol), Serum <10 (*)    All other components within normal limits  URINE RAPID DRUG SCREEN, HOSP PERFORMED - Abnormal; Notable for the following:    Opiates POSITIVE (*)    Cocaine POSITIVE (*)    All other components within normal limits  CBC WITH  DIFFERENTIAL/PLATELET  SALICYLATE LEVEL  ETHANOL    Imaging Review Dg Chest Portable 1 View  08/06/2015  CLINICAL DATA:  25 year old male with hypoxia following overdose. EXAM: PORTABLE CHEST 1 VIEW COMPARISON:  None. FINDINGS: The cardiomediastinal silhouette is unremarkable. There is no evidence of focal airspace disease, pulmonary edema, suspicious pulmonary nodule/mass, pleural effusion, or pneumothorax. No acute bony abnormalities are identified. IMPRESSION: No active disease. Electronically Signed   By: Harmon Pier M.D.   On: 08/06/2015 21:38   I have personally reviewed and evaluated these images and lab results as part of my medical decision-making.   EKG Interpretation   Date/Time:  Tuesday August 06 2015 20:35:12 EST Ventricular Rate:  87 PR Interval:  153 QRS Duration: 90 QT Interval:  368 QTC Calculation: 443 R Axis:   59 Text Interpretation:  Sinus rhythm Normal intervals. No ischemic changes.  Normal axis. No prior for comparison  Confirmed by LIU MD, DANA [phone]) on  08/06/2015 8:54:34 PM      MDM   Final diagnoses:  Drug overdose, accidental or unintentional, initial encounter    25 year old male who presents with accident drug overdose on heroin/fentanyl. Per EMS, patient alert and appropriate on their arrival and he did not require Narcan. Mother also states given rescue breathing, and he did not loose pulses. On my eval, is alert and appropriate. Neuro in tact. With pinpoint pupils and when left by himself, would fall asleep with bradypnea and decreased pulse ox to 91%. Quickly arouses to voice though and breathing improves when awake. Does not require narcan at this time.  EKG with no stigmata of arrhythmia or other concerning abnormalities. Basic blood work and tox screen only notable for opiates and cocaine. Patient observed in ED until patient breathing comfortably with normal rate and resolution of somnolence. Mother states he is at baseline mental status.  Given resources for outpatient detox. Strict return and follow-up instructions reviewed. She expressed understanding of all discharge instructions and felt comfortable with the plan of care.   I personally performed the services described in this documentation, which was scribed in my presence. The recorded information has been reviewed and is accurate.   Lavera Guise, MD 08/07/15 [phone]

## 2015-08-06 NOTE — Discharge Instructions (Signed)
Accidental Overdose A drug overdose occurs when a chemical substance (drug or medication) is used in amounts large enough to overcome a person. This may result in severe illness or death. This is a type of poisoning. Accidental overdoses of medications or other substances come from a variety of reasons. When this happens accidentally, it is often because the person taking the substance does not know enough about what they have taken. Drugs which commonly cause overdose deaths are alcohol, psychotropic medications (medications which affect the mind), pain medications, illegal drugs (street drugs) such as cocaine and heroin, and multiple drugs taken at the same time. It may result from careless behavior (such as over-indulging at a party). Other causes of overdose may include multiple drug use, a lapse in memory, or drug use after a period of no drug use.  Sometimes overdosing occurs because a person cannot remember if they have taken their medication.  A common unintentional overdose in young children involves multi-vitamins containing iron. Iron is a part of the hemoglobin molecule in blood. It is used to transport oxygen to living cells. When taken in small amounts, iron allows the body to restock hemoglobin. In large amounts, it causes problems in the body. If this overdose is not treated, it can lead to death. Never take medicines that show signs of tampering or do not seem quite right. Never take medicines in the dark or in poor lighting. Read the label and check each dose of medicine before you take it. When adults are poisoned, it happens most often through carelessness or lack of information. Taking medicines in the dark or taking medicine prescribed for someone else to treat the same type of problem is a dangerous practice. SYMPTOMS  Symptoms of overdose depend on the medication and amount taken. They can vary from over-activity with stimulant over-dosage, to sleepiness from depressants such as  alcohol, narcotics and tranquilizers. Confusion, dizziness, nausea and vomiting may be present. If problems are severe enough coma and death may result. DIAGNOSIS  Diagnosis and management are generally straightforward if the drug is known. Otherwise it is more difficult. At times, certain symptoms and signs exhibited by the patient, or blood tests, can reveal the drug in question.  TREATMENT  In an emergency department, most patients can be treated with supportive measures. Antidotes may be available if there has been an overdose of opioids or benzodiazepines. A rapid improvement will often occur if this is the cause of overdose. At home or away from medical care:  There may be no immediate problems or warning signs in children.  Not everything works well in all cases of poisoning.  Take immediate action. Poisons may act quickly.  If you think someone has swallowed medicine or a household product, and the person is unconscious, having seizures (convulsions), or is not breathing, immediately call for an ambulance. IF a person is conscious and appears to be doing OK but has swallowed a poison:  Do not wait to see what effect the poison will have. Immediately call a poison control center (listed in the white pages of your telephone book under "Poison Control" or inside the front cover with other emergency numbers). Some poison control centers have TTY capability for the deaf. Check with your local center if you or someone in your family requires this service.  Keep the container so you can read the label on the product for ingredients.  Describe what, when, and how much was taken and the age and condition of the person  poisoned. Inform them if the person is vomiting, choking, drowsy, shows a change in color or temperature of skin, is conscious or unconscious, or is convulsing.  Do not cause vomiting unless instructed by medical personnel. Do not induce vomiting or force liquids into a person who  is convulsing, unconscious, or very drowsy. Stay calm and in control.   Activated charcoal also is sometimes used in certain types of poisoning and you may wish to add a supply to your emergency medicines. It is available without a prescription. Call a poison control center before using this medication. PREVENTION  Thousands of children die every year from unintentional poisoning. This may be from household chemicals, poisoning from carbon monoxide in a car, taking their parent's medications, or simply taking a few iron pills or vitamins with iron. Poisoning comes from unexpected sources.  Store medicines out of the sight and reach of children, preferably in a locked cabinet. Do not keep medications in a food cabinet. Always store your medicines in a secure place. Get rid of expired medications.  If you have children living with you or have them as occasional guests, you should have child-resistant caps on your medicine containers. Keep everything out of reach. Child proof your home.  If you are called to the telephone or to answer the door while you are taking a medicine, take the container with you or put the medicine out of the reach of small children.  Do not take your medication in front of children. Do not tell your child how good a medication is and how good it is for them. They may get the idea it is more of a treat.  If you are an adult and have accidentally taken an overdose, you need to consider how this happened and what can be done to prevent it from happening again. If this was from a street drug or alcohol, determine if there is a problem that needs addressing. If you are not sure a problems exists, it is easy to talk to a professional and ask them if they think you have a problem. It is better to handle this problem in this way before it happens again and has a much worse consequence.   This information is not intended to replace advice given to you by your health care provider. Make  sure you discuss any questions you have with your health care provider.   Document Released: 09/12/2004 Document Revised: 07/20/2014 Document Reviewed: 12/17/2014 Elsevier Interactive Patient Education 2016 [employer].    Emergency Department Resource Guide 1) Find a Doctor and Pay Out of Pocket Although you won't have to find out who is covered by your insurance plan, it is a good idea to ask around and get recommendations. You will then need to call the office and see if the doctor you have chosen will accept you as a new patient and what types of options they offer for patients who are self-pay. Some doctors offer discounts or will set up payment plans for their patients who do not have insurance, but you will need to ask so you aren't surprised when you get to your appointment.  2) Contact Your Local Health Department Not all health departments have doctors that can see patients for sick visits, but many do, so it is worth a call to see if yours does. If you don't know where your local health department is, you can check in your phone book. The CDC also has a tool to help you locate  your state's health department, and many state websites also have listings of all of their local health departments.  3) Find a Walk-in Clinic If your illness is not likely to be very severe or complicated, you may want to try a walk in clinic. These are popping up all over the country in pharmacies, drugstores, and shopping centers. They're usually staffed by nurse practitioners or physician assistants that have been trained to treat common illnesses and complaints. They're usually fairly quick and inexpensive. However, if you have serious medical issues or chronic medical problems, these are probably not your best option.  No Primary Care Doctor: - Call Health Connect at  [phone] - they can help you locate a primary care doctor that  accepts your insurance, provides certain services, etc. - Physician Referral  Service- [phone]  Chronic Pain Problems: Organization         Address  Phone   Notes  [facility]  [phone] Patients need to be referred by their primary care doctor.   Medication Assistance: Organization         Address  Phone   Notes  [facility] Medication [facility] [address] [place] [place], [place] [address] [phone] --Must be a resident of [facility] -- Must have NO insurance coverage whatsoever (no Medicaid/ Medicare, etc.) -- The pt. MUST have a primary care doctor that directs their care regularly and follows them in the community   [facility]  [phone]   [employer]  [phone]    Agencies that provide inexpensive medical care: Organization         Address  Phone   Notes  [facility]  [phone]   [facility]    [phone]   [facility] [address] [place], [place] [address] [phone]   [facility] [address] [place], [place] [phone]   Planned Parenthood    [phone]   [facility]    [phone]   [facility]  [address], [place] Phone:  [phone], Fax:  [phone] Hours of Operation:  9 am - 6 pm, M-F.  Also accepts Medicaid/Medicare and self-pay.  [facility]  [address], [place] Phone: [phone], Fax: [phone]. Hours of Operation:  8:30 am - 5:30 pm, M-F.  Also accepts Medicaid and self-pay.  [facility] [place] [address], [place] Phone: [phone]   [facility] [address] Natasha Bence [place], [place] [phone], Ext. [phone] Mondays & Thursdays: 7-9 AM.  First 15 patients are seen on a first come, first serve basis.    Medicaid-accepting [facility] Providers:  Organization         Address  Phone   Notes  [facility] [address],  [phone] Also accepts self-pay patients.  [facility] [address] Laurell Josephs [place], [place]  [phone]   [facility] [address], [place] [phone]   [facility] [address] [place] [phone]   Renaye Rakers [address], [place]   [phone] Only accepts [place] Access [place] patients after they have their name applied to their card.   Self-Pay (no insurance) in [facility]:  Organization  Address  Phone   Notes  Sickle Cell Patients, [facility] [address], [place] [phone]   [facility] [address], [place] [phone]   [facility] [place]  [address], [place] [phone]   [facility]/Dr. Osei-Bonsu  [address] [place] [address], [place] or [phone], [place] [phone] Phone number for both [place] and [place] locations is the same.  [facility] [address], [place] [phone]   [facility] [address] [place] [address], [place] or [address] [phone]   [facility] [address], [place] [phone]) [phone], phone; [phone], fax Sees patients 1st and 3rd Saturday of every month.  Must not qualify for public or private insurance (i.e. Medicaid, Medicare, [place] Health Choice, Veterans' Benefits)  Household income should be no more than 200% of the poverty level The clinic cannot treat you if you are pregnant or think you are pregnant  Sexually transmitted diseases are not treated at the clinic.    Dental Care: Organization         Address  Phone  Notes  [facility] [address], [place] [phone] Accepts children up to age 24 who are enrolled in  [place] or [place] Health Choice; pregnant women with a Medicaid card; and children who have applied for Medicaid or [place] Health Choice, but were declined, whose parents can pay a reduced fee at time of service.  [facility] [place]  [address], [place] [phone] Accepts children up to age 24 who are enrolled in [place] or [place] Health Choice; pregnant women with a Medicaid card; and children who have applied for Medicaid or [place] Health Choice, but were declined, whose parents can pay a reduced fee at time of service.  [facility]  [address], [place] [phone] Patients are seen by appointment only. Walk-ins are not accepted. [facility] will see patients 24 years of age and older. Monday - Tuesday (8am-5pm) Most Wednesdays (8:30-5pm) $30 per visit, cash only  [facility]  [address], [facility] [place] [phone] Patients are seen by appointment only. Walk-ins are not accepted. [facility] will see patients 24 years of age and older. One Wednesday Evening (Monthly: Volunteer Based).  $30 per visit, cash only  [employer] [facility] [facility]  [phone] for adults; Children under age 294, call Graduate Pediatric Dentistry at [phone]. Children aged 884-14, please call [phone] to request a pediatric application.  Dental services are provided in all areas of dental care including fillings, crowns and bridges, complete and partial dentures, implants, gum treatment, root canals, and extractions. Preventive care is also provided. Treatment is provided to both adults and children. Patients are selected via a lottery and there is often a waiting list.   [facility] [address], [place]  [phone] [email or web address]   [facility] [address], [place], [place] [phone], Ext. [phone] Second and Fourth Thursday of each month, opens at 6:30  AM; Clinic ends at 9 AM.  Patients are seen on a first-come first-served basis, and a limited number are seen during each clinic.   [facility]  [address], [place]  [place], [place] [phone]   Eligibility Requirements You must have lived in [place], [place], or [place] counties for at least the last three months.   You cannot be eligible for state or federal sponsored [place], including [employer], [place], or [employer].   You generally cannot be eligible for healthcare insurance through your employer.    How to apply: Eligibility screenings are held every Tuesday and Wednesday afternoon from 1:00 pm until 4:00 pm. You do not need an appointment for the interview!  [facility] [address], [place], [place] [phone]   [facility] Health Department  [phone]   [facility] Health Department  [phone]   [facility] Health Department  [phone]    Behavioral Health Resources in the Community: Intensive Outpatient Programs Organization         Address  Phone  Notes  [facility] [address] [place], [place] [phone]   [facility] Outpatient [address] [place], [place] [phone]   [facility] [address] [place], [place]  [phone]   [facility] Mental Health [address],  [place], [place] [phone] or [phone]   Substance Abuse Resources Organization         Address  Phone  Notes  [facility] Drug Services  [phone]   [facility]  [phone]   [facility]  [phone]   Floydene Flock  [phone]   Residential & Outpatient Substance Abuse Program  [phone]   Psychological Services Organization         Address  Phone  Notes  [facility]  [phone]   [facility]  [phone]   [facility] Mental Health [address], [place] [phone] or  [phone]    Mobile Crisis Teams Organization         Address  Phone  Notes  [facility], Mobile Crisis Care Unit  [phone]   [facility]  [address] [place], [place] [phone]   Doristine Locks [address] [place] [phone]    Self-Help/Support Groups Organization         Address  Phone             Notes  [facility] [place] - variety of support groups  [phone]- I7437963 Call for more information  Narcotics Anonymous (NA), [facility] [address], [employer] [place]  2 meetings at this location   Statistician         Address  Phone  Notes  [facility] [address],    [place]  [phone]   [facility]  [address] [place] [address], [place], [place] [phone]   [facility] [address] [place], [place] [phone] Admissions: 8am-3pm M-F  [facility] [address],    [place], [place] [phone]   [facility] [address] Starling Manns [place], [place] [phone]   [facility] [address],  [place], [place] [phone]   [facility] - Intensive Outpatient [address] Laurell Josephs [address], [place], [place] [phone]   [facility] [address] [place]  [place], [place] [phone] or [phone]   [facility] ([facility]) [address] [place], [place] [phone] Accepts Medicaid  [facility] [address]  [place] [phone] Substance Abuse/Addiction Treatment   [facility] Resources Organization  Address  Phone  Notes  [facility]  [phone]   Angie Fava, PhD [address] Ervin Knack [place], [place]   [phone] or [phone]   [facility] Behavioral   [address] [place], [place] [phone]   [facility] [address]  [place], [place] [phone] Insurance/Medicaid/sponsorship through [facility] [address]                                    [place], [place] [phone] Therapy/tele-psych/case  [facility] [address], [place] [phone]    Dr. Lolly Mustache  [phone]   [facility]  [employer] [address] [facility] Dept. 1) [address], [place] 2) [address] [place] 3)  [address], [place] [phone]  [phone]   [facility] Child Abuse Hotline 848-292-4430 or 859-322-9982 (After Hours)

## 2015-08-06 NOTE — ED Notes (Signed)
Pt states he has been using for the last 4-5 days, injecting heroin; pt states he has been using IV drugs for the last year

## 2015-08-06 NOTE — ED Notes (Signed)
PT states understanding of care given and follow up instructions.  Pt ambulated from ED

## 2015-08-06 NOTE — ED Notes (Signed)
Pt brought in by [facility] for c/o overdose on heroin, fentyl and "[place] white"; pt was with friends and mom states pt was on couch when he became unresponsive; pt was given rescue breathing and became alert; pt is alert and oriented upon arrival to ED

## 2015-08-09 DIAGNOSIS — F1120 Opioid dependence, uncomplicated: Secondary | ICD-10-CM | POA: Diagnosis not present

## 2015-08-09 DIAGNOSIS — F1123 Opioid dependence with withdrawal: Secondary | ICD-10-CM | POA: Diagnosis not present

## 2015-08-09 MED FILL — GABAPENTIN 300 MG CAPSULE: 300 | 30 days supply | Qty: 90 | Fill #0

## 2015-08-09 MED FILL — ONDANSETRON ODT 8 MG TABLET: 8 | 7 days supply | Qty: 20 | Fill #0

## 2015-08-09 MED FILL — cloNIDine HCL 0.1 MG TABS: 0.1 | 10 days supply | Qty: 20 | Fill #0

## 2015-08-20 MED FILL — clonazePAM 1 MG TABS: 1 | 30 days supply | Qty: 60 | Fill #0

## 2015-08-20 MED FILL — AMPHETAMINE SALTS 30 MG TAB: 30 | 30 days supply | Qty: 60 | Fill #0

## 2015-09-17 MED FILL — clonazePAM 1 MG TABS: 1 | 30 days supply | Qty: 60 | Fill #1

## 2015-09-18 MED FILL — AMPHETAMINE SALTS 30 MG TAB: 30 | 30 days supply | Qty: 60 | Fill #0

## 2015-09-24 DIAGNOSIS — H16201 Unspecified keratoconjunctivitis, right eye: Secondary | ICD-10-CM | POA: Diagnosis not present

## 2015-10-03 MED FILL — VIVITROL 380 MG VIAL + DILU: 380 | 30 days supply | Qty: 1 | Fill #0

## 2015-10-04 DIAGNOSIS — F1120 Opioid dependence, uncomplicated: Secondary | ICD-10-CM | POA: Diagnosis not present

## 2015-10-08 MED FILL — clonazePAM 1 MG TABS: 1 | 30 days supply | Qty: 90 | Fill #0

## 2015-10-10 DIAGNOSIS — F1120 Opioid dependence, uncomplicated: Secondary | ICD-10-CM | POA: Diagnosis not present

## 2015-10-18 MED FILL — AMPHETAMINE SALTS 30 MG TAB: 30 | 30 days supply | Qty: 60 | Fill #0

## 2015-11-01 DIAGNOSIS — F1120 Opioid dependence, uncomplicated: Secondary | ICD-10-CM | POA: Diagnosis not present

## 2015-11-01 MED FILL — GABAPENTIN 600 MG TABLET: 600 | 30 days supply | Qty: 120 | Fill #0

## 2015-11-01 MED FILL — SUBOXONE 8 MG-2 MG SL FILM: 8-2 | 30 days supply | Qty: 60 | Fill #0

## 2015-11-07 MED FILL — ALPRAZolam ER 1 MG TB24: 1 | 30 days supply | Qty: 30 | Fill #0

## 2015-11-07 MED FILL — ALPRAZolam 1 MG TABS: 1 | 30 days supply | Qty: 60 | Fill #0

## 2015-11-18 MED FILL — AMPHETAMINE SALTS 30 MG TAB: 30 | 30 days supply | Qty: 60 | Fill #0

## 2015-12-02 DIAGNOSIS — F1121 Opioid dependence, in remission: Secondary | ICD-10-CM | POA: Diagnosis not present

## 2015-12-05 MED FILL — ALPRAZolam 1 MG TABS: 1 | 30 days supply | Qty: 90 | Fill #0

## 2015-12-17 MED FILL — AMPHETAMINE SALTS 30 MG TAB: 30 | 30 days supply | Qty: 60 | Fill #0

## 2016-01-07 MED FILL — ALPRAZolam 1 MG TABS: 1 | 30 days supply | Qty: 90 | Fill #0

## 2016-01-20 MED FILL — AMPHETAMINE SALTS 30 MG TAB: 30 | 30 days supply | Qty: 60 | Fill #0

## 2016-02-10 MED FILL — ALPRAZolam 1 MG TABS: 1 | 30 days supply | Qty: 90 | Fill #1

## 2016-03-17 MED FILL — AMPHETAMINE SALTS 30 MG TAB: 30 | 15 days supply | Qty: 45 | Fill #0

## 2016-03-17 MED FILL — ALPRAZolam 1 MG TABS: 1 | 15 days supply | Qty: 45 | Fill #0

## 2016-04-22 ENCOUNTER — Telehealth: Payer: Self-pay | Admitting: Family Medicine

## 2016-04-22 NOTE — Telephone Encounter (Signed)
Pt is [employer] in [employer] and states that he was told that he has pectus excavatum. Pt states that he has been Dr. Soyla DryerSteve's pt since a child and is wanting his opinion. Pt states that he is having to undergo xrays on Monday to determine if it is affecting his lung capacity or causing stress on his heart. Please advise.

## 2016-04-23 NOTE — Telephone Encounter (Signed)
Dr Brett CanalesSteve to see on Friday- notify pt please

## 2016-04-24 NOTE — Telephone Encounter (Signed)
Left message on voicemail return call  

## 2016-04-24 NOTE — Telephone Encounter (Signed)
Recommend o v to discuss after pt has his tests, be sure to bring results with him if done outsidde the system

## 2016-05-06 NOTE — Telephone Encounter (Signed)
Left message to return call 

## 2016-05-29 NOTE — Telephone Encounter (Signed)
Left message return call 05/29/16 

## 2019-08-14 ENCOUNTER — Encounter: Payer: Self-pay | Admitting: Family Medicine
